# Patient Record
Sex: Female | Born: 1990 | Race: Asian | Hispanic: No | Marital: Married | Smoking: Never smoker
Health system: Southern US, Community
[De-identification: ages and names within clinical notes are randomized; demographics above are authoritative.]

## PROBLEM LIST (undated history)

## (undated) DIAGNOSIS — Z789 Other specified health status: Secondary | ICD-10-CM

## (undated) HISTORY — PX: NO PAST SURGERIES: SHX2092

---

## 2013-02-01 LAB — OB RESULTS CONSOLE RUBELLA ANTIBODY, IGM: Rubella: IMMUNE

## 2013-02-01 LAB — OB RESULTS CONSOLE ANTIBODY SCREEN: Antibody Screen: NEGATIVE

## 2013-02-01 LAB — OB RESULTS CONSOLE HEPATITIS B SURFACE ANTIGEN: Hepatitis B Surface Ag: NEGATIVE

## 2013-02-01 LAB — OB RESULTS CONSOLE GC/CHLAMYDIA
Chlamydia: POSITIVE
Gonorrhea: NEGATIVE

## 2013-02-01 LAB — OB RESULTS CONSOLE HIV ANTIBODY (ROUTINE TESTING): HIV: NONREACTIVE

## 2013-02-01 LAB — OB RESULTS CONSOLE RPR: RPR: NONREACTIVE

## 2013-02-01 LAB — OB RESULTS CONSOLE ABO/RH: "RH Type ": POSITIVE

## 2013-02-03 LAB — OB RESULTS CONSOLE GBS: STREP GROUP B AG: POSITIVE

## 2013-02-22 NOTE — L&D Delivery Note (Signed)
Called to bedside when patient noticed increased pressure and involuntary pushing. Patient was complete and pushed for 80 minutes. Head was delivered over intact perineum and shoulders were extracted from vagina without difficulty. Cord was clamped after 3 minutes. Pitocin was started for PPH prophylaxis. Placenta was removed and appeared intact. Brisk fundal massage was applied.Perineum was examined and revealed a straight 2nd degree tear, repaired with 2 interrupted deep 3-0 vicryl sutures and closed with subcuticular stitch. Vaginal bleeding was minimal. Vagina was inspected and found to be intact with no other active bleeding. Needle and sponge count was correct. Patient was cleaned and prepped for transfer to mother baby.  Delivery Note At 2:19 PM a viable female was delivered via Vaginal, Spontaneous Delivery (Presentation: Left Occiput Anterior).  APGAR: 7, 9; weight .   Placenta status: Intact, Spontaneous.  Cord: 3 vessels with the following complications: None.  Cord pH: pending  Anesthesia: Epidural  Episiotomy: None Lacerations: 2nd degree Suture Repair: 3.0 vicryl Est. Blood Loss (mL): 300  Mom to postpartum.  Baby to Nursery.  Holly Elliott, Holly Elliott A 09/06/2013, 3:02 PM

## 2013-03-01 ENCOUNTER — Other Ambulatory Visit (HOSPITAL_COMMUNITY): Payer: Self-pay | Admitting: Nurse Practitioner

## 2013-03-01 DIAGNOSIS — Z3689 Encounter for other specified antenatal screening: Secondary | ICD-10-CM

## 2013-03-29 ENCOUNTER — Ambulatory Visit (HOSPITAL_COMMUNITY): Admission: RE | Admit: 2013-03-29 | Payer: Medicaid Other | Source: Ambulatory Visit

## 2013-04-13 ENCOUNTER — Other Ambulatory Visit (HOSPITAL_COMMUNITY): Payer: Self-pay | Admitting: Nurse Practitioner

## 2013-04-13 DIAGNOSIS — Z3689 Encounter for other specified antenatal screening: Secondary | ICD-10-CM

## 2013-04-20 ENCOUNTER — Ambulatory Visit (HOSPITAL_COMMUNITY): Payer: Medicaid Other | Attending: Nurse Practitioner

## 2013-04-30 ENCOUNTER — Ambulatory Visit (HOSPITAL_COMMUNITY)
Admission: RE | Admit: 2013-04-30 | Discharge: 2013-04-30 | Disposition: A | Discharge status: Home / Self Care | Payer: Medicaid Other | Source: Ambulatory Visit | Attending: Nurse Practitioner | Admitting: Nurse Practitioner

## 2013-04-30 DIAGNOSIS — Z3689 Encounter for other specified antenatal screening: Secondary | ICD-10-CM | POA: Insufficient documentation

## 2013-04-30 NOTE — Progress Notes (Signed)
MFM ultrasound  23 yr old G1P0 at 1370w1d for fetal anatomic survey. Remote read.  Findings: 1. Single intrauterine pregnancy. 2. Estimated fetal weight is in the 53rd%. 3. Posterior placenta without evidence of previa. 4. Normal amniotic fluid volume. 5. Normal transabdominal cervical length. 6. The views of the profile/nasal bone, heart, and hands are limited. 7. The remainder of the limited anatomy survey is normal.  Recommendations: 1. Appropriate fetal growth. 2. Limited anatomy survey: - recommend follow up ultrasound in 3-4 weeks to complete anatomic survey  Eulis FosterKristen Mariah Gerstenberger, MD

## 2013-05-11 ENCOUNTER — Other Ambulatory Visit (HOSPITAL_COMMUNITY): Payer: Self-pay | Admitting: Nurse Practitioner

## 2013-05-11 DIAGNOSIS — IMO0002 Reserved for concepts with insufficient information to code with codable children: Secondary | ICD-10-CM

## 2013-05-11 DIAGNOSIS — Z0489 Encounter for examination and observation for other specified reasons: Secondary | ICD-10-CM

## 2013-06-01 ENCOUNTER — Ambulatory Visit (HOSPITAL_COMMUNITY)
Admission: RE | Admit: 2013-06-01 | Discharge: 2013-06-01 | Disposition: A | Discharge status: Home / Self Care | Payer: Medicaid Other | Source: Ambulatory Visit | Attending: Nurse Practitioner | Admitting: Nurse Practitioner

## 2013-06-01 DIAGNOSIS — IMO0002 Reserved for concepts with insufficient information to code with codable children: Secondary | ICD-10-CM

## 2013-06-01 DIAGNOSIS — Z3689 Encounter for other specified antenatal screening: Secondary | ICD-10-CM | POA: Insufficient documentation

## 2013-06-01 DIAGNOSIS — Z0489 Encounter for examination and observation for other specified reasons: Secondary | ICD-10-CM

## 2013-08-31 ENCOUNTER — Ambulatory Visit (INDEPENDENT_AMBULATORY_CARE_PROVIDER_SITE_OTHER): Payer: Medicaid Other | Admitting: *Deleted

## 2013-08-31 VITALS — BP 127/61 | HR 90

## 2013-08-31 DIAGNOSIS — O480 Post-term pregnancy: Secondary | ICD-10-CM

## 2013-08-31 LAB — US OB FOLLOW UP

## 2013-08-31 NOTE — Progress Notes (Signed)
NST performed today was reviewed and was found to be reactive.  AFI is 18.2 cm. Continue recommended antenatal testing and prenatal care.

## 2013-08-31 NOTE — Patient Instructions (Signed)
Return to clinic for any obstetric concerns or go to [facility] for evaluation  

## 2013-08-31 NOTE — Progress Notes (Signed)
Sx of labor reviewed. Interpreter present for entire visit.

## 2013-09-03 ENCOUNTER — Telehealth (HOSPITAL_COMMUNITY): Payer: Self-pay | Admitting: *Deleted

## 2013-09-03 NOTE — Telephone Encounter (Signed)
Preadmission screen  

## 2013-09-04 NOTE — Telephone Encounter (Signed)
Interpreter number [phone]

## 2013-09-05 ENCOUNTER — Encounter (HOSPITAL_COMMUNITY): Payer: Self-pay

## 2013-09-05 ENCOUNTER — Inpatient Hospital Stay (HOSPITAL_COMMUNITY)
Admission: RE | Admit: 2013-09-05 | Discharge: 2013-09-08 | DRG: 775 | Disposition: A | Discharge status: Home / Self Care | Payer: Medicaid Other | Source: Ambulatory Visit | Attending: Obstetrics & Gynecology | Admitting: Obstetrics & Gynecology

## 2013-09-05 DIAGNOSIS — O99892 Other specified diseases and conditions complicating childbirth: Secondary | ICD-10-CM | POA: Diagnosis present

## 2013-09-05 DIAGNOSIS — O9902 Anemia complicating childbirth: Secondary | ICD-10-CM | POA: Diagnosis present

## 2013-09-05 DIAGNOSIS — Z3A41 41 weeks gestation of pregnancy: Secondary | ICD-10-CM

## 2013-09-05 DIAGNOSIS — D649 Anemia, unspecified: Secondary | ICD-10-CM | POA: Diagnosis present

## 2013-09-05 DIAGNOSIS — Z22330 Carrier of Group B streptococcus: Secondary | ICD-10-CM

## 2013-09-05 DIAGNOSIS — O480 Post-term pregnancy: Secondary | ICD-10-CM | POA: Diagnosis present

## 2013-09-05 DIAGNOSIS — O9989 Other specified diseases and conditions complicating pregnancy, childbirth and the puerperium: Secondary | ICD-10-CM

## 2013-09-05 HISTORY — DX: Other specified health status: Z78.9

## 2013-09-05 LAB — CBC
HCT: 30.3 % — ABNORMAL LOW (ref 36.0–46.0)
HEMOGLOBIN: 10 g/dL — AB (ref 12.0–15.0)
MCH: 27.9 pg (ref 26.0–34.0)
MCHC: 33 g/dL (ref 30.0–36.0)
MCV: 84.4 fL (ref 78.0–100.0)
Platelets: 251 10*3/uL (ref 150–400)
RBC: 3.59 MIL/uL — AB (ref 3.87–5.11)
RDW: 12.5 % (ref 11.5–15.5)
WBC: 7.9 10*3/uL (ref 4.0–10.5)

## 2013-09-05 LAB — RAPID HIV SCREEN (WH-MAU): SUDS RAPID HIV SCREEN: NONREACTIVE

## 2013-09-05 MED ORDER — ACETAMINOPHEN 325 MG PO TABS: 650.0000 mg | ORAL_TABLET | ORAL | Status: DC | PRN

## 2013-09-05 MED ORDER — LIDOCAINE HCL (PF) 1 % IJ SOLN
30.0000 mL | INTRAMUSCULAR | Status: AC | PRN
  Administered 2013-09-06: 30 mL via SUBCUTANEOUS
  Filled 2013-09-05: qty 30

## 2013-09-05 MED ORDER — MISOPROSTOL 25 MCG QUARTER TABLET
50.0000 ug | ORAL_TABLET | Freq: Once | ORAL | Status: AC
  Administered 2013-09-05: 50 ug via ORAL
  Filled 2013-09-05: qty 1

## 2013-09-05 MED ORDER — CITRIC ACID-SODIUM CITRATE 334-500 MG/5ML PO SOLN: 30.0000 mL | ORAL | Status: DC | PRN

## 2013-09-05 MED ORDER — LACTATED RINGERS IV SOLN
INTRAVENOUS | Status: DC
  Administered 2013-09-06: 11:00:00 via INTRAVENOUS

## 2013-09-05 MED ORDER — ONDANSETRON HCL 4 MG/2ML IJ SOLN: 4.0000 mg | Freq: Four times a day (QID) | INTRAMUSCULAR | Status: DC | PRN

## 2013-09-05 MED ORDER — OXYTOCIN 40 UNITS IN LACTATED RINGERS INFUSION - SIMPLE MED
62.5000 mL/h | INTRAVENOUS | Status: DC
  Administered 2013-09-06: 62.5 mL/h via INTRAVENOUS
  Filled 2013-09-05: qty 1000

## 2013-09-05 MED ORDER — OXYCODONE-ACETAMINOPHEN 5-325 MG PO TABS: 1.0000 | ORAL_TABLET | ORAL | Status: DC | PRN

## 2013-09-05 MED ORDER — IBUPROFEN 600 MG PO TABS
600.0000 mg | ORAL_TABLET | Freq: Four times a day (QID) | ORAL | Status: DC | PRN
  Administered 2013-09-06: 600 mg via ORAL
  Filled 2013-09-05: qty 1

## 2013-09-05 MED ORDER — PENICILLIN G POTASSIUM 5000000 UNITS IJ SOLR
5.0000 10*6.[IU] | Freq: Once | INTRAVENOUS | Status: AC
  Administered 2013-09-06: 5 10*6.[IU] via INTRAVENOUS
  Filled 2013-09-05: qty 5

## 2013-09-05 MED ORDER — DEXTROSE 5 % IV SOLN
2.5000 10*6.[IU] | INTRAVENOUS | Status: DC
  Administered 2013-09-06 (×2): 2.5 10*6.[IU] via INTRAVENOUS
  Filled 2013-09-05 (×7): qty 2.5

## 2013-09-05 MED ORDER — TERBUTALINE SULFATE 1 MG/ML IJ SOLN: 0.2500 mg | Freq: Once | INTRAMUSCULAR | Status: AC | PRN

## 2013-09-05 MED ORDER — ZOLPIDEM TARTRATE 5 MG PO TABS: 5.0000 mg | ORAL_TABLET | Freq: Every evening | ORAL | Status: DC | PRN

## 2013-09-05 MED ORDER — FENTANYL CITRATE 0.05 MG/ML IJ SOLN
100.0000 ug | INTRAMUSCULAR | Status: DC | PRN
  Administered 2013-09-06 (×4): 100 ug via INTRAVENOUS
  Filled 2013-09-05 (×4): qty 2

## 2013-09-05 MED ORDER — LACTATED RINGERS IV SOLN: 500.0000 mL | INTRAVENOUS | Status: DC | PRN

## 2013-09-05 MED ORDER — OXYTOCIN BOLUS FROM INFUSION: 500.0000 mL | INTRAVENOUS | Status: DC

## 2013-09-05 NOTE — H&P (Signed)
Holly Elliott is a 23 y.o. female G1P0 with IUP at 319w3d presenting for IOL 2/2 postdates. Pt states she has been having none contractions, associated with none vaginal bleeding.  Membranes are intact, with active fetal movement.   PNCare at [facility] since 14 wks  Prenatal History/Complications: Pt. Is 23 y/o G1 with no significant PMHx and this pregnancy complicated by early chlamydia infection treated and partner treated. She is also GBS positive. 27W ultrasound shows normal fetal growth, with weight at 45%. She denies LOF, VB, Contractions up to this point. She has +FM. She is here for induction 2/2 postdates. She denies any PMHx. She has no other complaints.    Past Medical History: Past Medical History  Diagnosis Date  . Medical history non-contributory     Past Surgical History: Past Surgical History  Procedure Laterality Date  . No past surgeries      Obstetrical History: OB History   Grav Para Term Preterm Abortions TAB SAB Ect Mult Living   1               Gynecological History: OB History   Grav Para Term Preterm Abortions TAB SAB Ect Mult Living   1               Social History: History   Social History  . Marital Status: Married    Spouse Name: N/A    Number of Children: N/A  . Years of Education: N/A   Social History Main Topics  . Smoking status: None  . Smokeless tobacco: None  . Alcohol Use: No  . Drug Use: No  . Sexual Activity: None   Other Topics Concern  . None   Social History Narrative  . None    Family History: No family history on file.  Allergies: No Known Allergies  Prescriptions prior to admission  Medication Sig Dispense Refill  . Prenatal Vit-Fe Fumarate-FA (PRENATAL MULTIVITAMIN) TABS tablet Take 1 tablet by mouth daily at 12 noon.         Review of Systems   Constitutional: Per HPI above.   Blood pressure 124/69, pulse 85, temperature 98.4 F (36.9 C), temperature source Oral, resp. rate 18, height 5' (1.524 m), weight 68.04  kg (150 lb), last menstrual period 11/19/2012. General appearance: alert, cooperative and no distress Lungs: clear to auscultation bilaterally Heart: regular rate and rhythm Abdomen: soft, non-tender; bowel sounds normal Pelvic: 1.5cm, 40%, -3 Extremities: Homans sign is negative, no sign of DVT Grossly neurologically intact.  Presentation: cephalic Fetal monitoringBaseline: 135 bpm, Variability: Good {> 6 bpm) and Accelerations: Reactive Uterine activityNone Dilation: 1 Effacement (%): Thick Station: -3 Exam by:: LCarpenter,Rn   Prenatal labs: ABO, Rh: O/Positive/-- (12/11 0000) Antibody: Negative (12/11 0000) Rubella:   Immune RPR: Nonreactive (12/11 0000)  HBsAg: Negative (12/11 0000)  HIV: Non-reactive (12/11 0000)  GBS: Positive (12/13 0000)  1 hr Glucola - Unknown Genetic screening - Deferred Anatomy US - WNL   Prenatal Transfer Tool  Maternal Diabetes: Unknown. Pt. denies Genetic Screening: Declined Maternal Ultrasounds/Referrals: Normal Fetal Ultrasounds or other Referrals:  None Maternal Substance Abuse:  No Significant Maternal Medications:  None Significant Maternal Lab Results: Lab values include: Group B Strep positive, Other:  Varicella Non-Immune     Results for orders placed during the hospital encounter of 09/05/13 (from the past 24 hour(s))  CBC   Collection Time    09/05/13  8:00 PM      Result Value Ref Range   WBC 7.9  4.0 - 10.5 K/uL   RBC 3.59 (*) 3.87 - 5.11 MIL/uL   Hemoglobin 10.0 (*) 12.0 - 15.0 g/dL   HCT 16.1 (*) 09.6 - 04.5 %   MCV 84.4  78.0 - 100.0 fL   MCH 27.9  26.0 - 34.0 pg   MCHC 33.0  30.0 - 36.0 g/dL   RDW 40.9  81.1 - 91.4 %   Platelets 251  150 - 400 K/uL    Assessment: Holly Elliott is a 23 y.o. G1P0 at [redacted]w[redacted]d by L=14W U/S  here for IOL 2/2 postdates.  #Labor: Induction by Cervical ripening with cytotec, progression to foley bulb and pitocin with expectation of NSVD.  #Pain: Fentanyl #FWB: Cat I - 45% growth at 27w U/S.  Unknown Glucola, POCT glucose pending #ID:  GBS pos - PCN with active labor #MOF: Breast / Bottle #MOC: Nexplenon #Circ:  N/A  Melancon, Caleb G 09/05/2013, 11:36 PM  I have seen and examined this patient and I agree with the above. Cam Hai CNM 8:59 AM 09/06/2013

## 2013-09-05 NOTE — Progress Notes (Signed)
Dr. Reola CalkinsBeck called for orders for 1930 induction.

## 2013-09-06 ENCOUNTER — Encounter (HOSPITAL_COMMUNITY): Payer: Self-pay

## 2013-09-06 ENCOUNTER — Encounter (HOSPITAL_COMMUNITY): Payer: Medicaid Other | Admitting: Anesthesiology

## 2013-09-06 ENCOUNTER — Inpatient Hospital Stay (HOSPITAL_COMMUNITY): Payer: Medicaid Other | Admitting: Anesthesiology

## 2013-09-06 DIAGNOSIS — D649 Anemia, unspecified: Secondary | ICD-10-CM

## 2013-09-06 DIAGNOSIS — O480 Post-term pregnancy: Secondary | ICD-10-CM

## 2013-09-06 DIAGNOSIS — O99892 Other specified diseases and conditions complicating childbirth: Secondary | ICD-10-CM

## 2013-09-06 DIAGNOSIS — O9989 Other specified diseases and conditions complicating pregnancy, childbirth and the puerperium: Secondary | ICD-10-CM

## 2013-09-06 LAB — HEPATITIS B SURFACE ANTIGEN: Hepatitis B Surface Ag: NEGATIVE

## 2013-09-06 LAB — RPR

## 2013-09-06 LAB — TYPE AND SCREEN
ABO/RH(D): O POS
Antibody Screen: NEGATIVE

## 2013-09-06 LAB — VARICELLA ZOSTER ANTIBODY, IGG

## 2013-09-06 LAB — ABO/RH: ABO/RH(D): O POS

## 2013-09-06 LAB — RUBELLA SCREEN: Rubella: 4.02 Index — ABNORMAL HIGH (ref <0.90)

## 2013-09-06 MED ORDER — PRENATAL MULTIVITAMIN CH
1.0000 | ORAL_TABLET | Freq: Every day | ORAL | Status: DC
  Administered 2013-09-07: 1 via ORAL
  Filled 2013-09-06: qty 1

## 2013-09-06 MED ORDER — SIMETHICONE 80 MG PO CHEW: 80.0000 mg | CHEWABLE_TABLET | ORAL | Status: DC | PRN

## 2013-09-06 MED ORDER — IBUPROFEN 600 MG PO TABS
600.0000 mg | ORAL_TABLET | Freq: Four times a day (QID) | ORAL | Status: DC
  Administered 2013-09-07 – 2013-09-08 (×6): 600 mg via ORAL
  Filled 2013-09-06 (×6): qty 1

## 2013-09-06 MED ORDER — MISOPROSTOL 25 MCG QUARTER TABLET
25.0000 ug | ORAL_TABLET | ORAL | Status: DC
  Filled 2013-09-06 (×6): qty 1

## 2013-09-06 MED ORDER — PHENYLEPHRINE 40 MCG/ML (10ML) SYRINGE FOR IV PUSH (FOR BLOOD PRESSURE SUPPORT)
80.0000 ug | PREFILLED_SYRINGE | INTRAVENOUS | Status: DC | PRN
  Filled 2013-09-06: qty 2

## 2013-09-06 MED ORDER — FENTANYL 2.5 MCG/ML BUPIVACAINE 1/10 % EPIDURAL INFUSION (WH - ANES)
INTRAMUSCULAR | Status: AC
  Filled 2013-09-06: qty 125

## 2013-09-06 MED ORDER — FENTANYL 2.5 MCG/ML BUPIVACAINE 1/10 % EPIDURAL INFUSION (WH - ANES)
INTRAMUSCULAR | Status: DC | PRN
  Administered 2013-09-06: 12 mL/h via EPIDURAL

## 2013-09-06 MED ORDER — LACTATED RINGERS IV SOLN
500.0000 mL | Freq: Once | INTRAVENOUS | Status: AC
  Administered 2013-09-06: 500 mL via INTRAVENOUS

## 2013-09-06 MED ORDER — FENTANYL 2.5 MCG/ML BUPIVACAINE 1/10 % EPIDURAL INFUSION (WH - ANES): 14.0000 mL/h | INTRAMUSCULAR | Status: DC | PRN

## 2013-09-06 MED ORDER — DIPHENHYDRAMINE HCL 25 MG PO CAPS
25.0000 mg | ORAL_CAPSULE | Freq: Four times a day (QID) | ORAL | Status: DC | PRN
Start: 2013-09-06 — End: 2013-09-08

## 2013-09-06 MED ORDER — PHENYLEPHRINE 40 MCG/ML (10ML) SYRINGE FOR IV PUSH (FOR BLOOD PRESSURE SUPPORT)
PREFILLED_SYRINGE | INTRAVENOUS | Status: AC
  Filled 2013-09-06: qty 10

## 2013-09-06 MED ORDER — EPHEDRINE 5 MG/ML INJ
10.0000 mg | INTRAVENOUS | Status: DC | PRN
  Filled 2013-09-06: qty 2

## 2013-09-06 MED ORDER — ONDANSETRON HCL 4 MG PO TABS: 4.0000 mg | ORAL_TABLET | ORAL | Status: DC | PRN

## 2013-09-06 MED ORDER — ZOLPIDEM TARTRATE 5 MG PO TABS: 5.0000 mg | ORAL_TABLET | Freq: Every evening | ORAL | Status: DC | PRN

## 2013-09-06 MED ORDER — DIBUCAINE 1 % RE OINT: 1.0000 "application " | TOPICAL_OINTMENT | RECTAL | Status: DC | PRN

## 2013-09-06 MED ORDER — OXYCODONE-ACETAMINOPHEN 5-325 MG PO TABS: 1.0000 | ORAL_TABLET | ORAL | Status: DC | PRN

## 2013-09-06 MED ORDER — TETANUS-DIPHTH-ACELL PERTUSSIS 5-2.5-18.5 LF-MCG/0.5 IM SUSP: 0.5000 mL | Freq: Once | INTRAMUSCULAR | Status: DC

## 2013-09-06 MED ORDER — ONDANSETRON HCL 4 MG/2ML IJ SOLN: 4.0000 mg | INTRAMUSCULAR | Status: DC | PRN

## 2013-09-06 MED ORDER — DIPHENHYDRAMINE HCL 50 MG/ML IJ SOLN: 12.5000 mg | INTRAMUSCULAR | Status: DC | PRN

## 2013-09-06 MED ORDER — BENZOCAINE-MENTHOL 20-0.5 % EX AERO
1.0000 "application " | INHALATION_SPRAY | CUTANEOUS | Status: DC | PRN
  Administered 2013-09-06: 1 via TOPICAL
  Filled 2013-09-06: qty 56

## 2013-09-06 MED ORDER — LANOLIN HYDROUS EX OINT: TOPICAL_OINTMENT | CUTANEOUS | Status: DC | PRN

## 2013-09-06 MED ORDER — WITCH HAZEL-GLYCERIN EX PADS: 1.0000 "application " | MEDICATED_PAD | CUTANEOUS | Status: DC | PRN

## 2013-09-06 MED ORDER — SENNOSIDES-DOCUSATE SODIUM 8.6-50 MG PO TABS
2.0000 | ORAL_TABLET | ORAL | Status: DC
  Administered 2013-09-07 – 2013-09-08 (×2): 2 via ORAL
  Filled 2013-09-06 (×2): qty 2

## 2013-09-06 MED ORDER — TERBUTALINE SULFATE 1 MG/ML IJ SOLN
INTRAMUSCULAR | Status: AC
  Administered 2013-09-06: 0.25 mg
  Filled 2013-09-06: qty 1

## 2013-09-06 MED ORDER — LIDOCAINE HCL (PF) 1 % IJ SOLN
INTRAMUSCULAR | Status: DC | PRN
  Administered 2013-09-06 (×2): 4 mL

## 2013-09-06 NOTE — Anesthesia Preprocedure Evaluation (Addendum)
Anesthesia Evaluation  Patient identified by MRN, date of birth, ID band Patient awake    Reviewed: Allergy & Precautions, H&P , Patient's Chart, lab work & pertinent test results  Airway Mallampati: III TM Distance: >3 FB Neck ROM: Full    Dental no notable dental hx. (+) Teeth Intact   Pulmonary neg pulmonary ROS,  breath sounds clear to auscultation  Pulmonary exam normal       Cardiovascular negative cardio ROS  Rhythm:Regular Rate:Normal     Neuro/Psych negative neurological ROS  negative psych ROS   GI/Hepatic negative GI ROS, Neg liver ROS,   Endo/Other  negative endocrine ROS  Renal/GU negative Renal ROS  negative genitourinary   Musculoskeletal negative musculoskeletal ROS (+)   Abdominal   Peds  Hematology  (+) anemia ,   Anesthesia Other Findings   Reproductive/Obstetrics (+) Pregnancy                          Anesthesia Physical Anesthesia Plan  ASA: II  Anesthesia Plan: Epidural   Post-op Pain Management:    Induction:   Airway Management Planned: Natural Airway  Additional Equipment:   Intra-op Plan:   Post-operative Plan:   Informed Consent: I have reviewed the patients History and Physical, chart, labs and discussed the procedure including the risks, benefits and alternatives for the proposed anesthesia with the patient or authorized representative who has indicated his/her understanding and acceptance.     Plan Discussed with: Anesthesiologist  Anesthesia Plan Comments:         Anesthesia Quick Evaluation

## 2013-09-06 NOTE — Progress Notes (Signed)
[employer] [phone]   ) used to translate plan of care, options for pain relief, and epidural procedure.

## 2013-09-06 NOTE — Lactation Note (Signed)
This note was copied from the chart of Holly Elliott. Lactation Consultation Note  Patient Name: Holly Malena CatholicHMan Turbin WUJWJ'XToday's Date: 09/06/2013 Reason for consult: Initial assessment;Other (Comment) (charting for exclusion) Mom's female friend is at bedside, as well as FOB and her female friend is able to translate basic breastfeeding information to mom.  LC reviewed LEAD cautions regarding formula supplement and recommendations of waiting at least 2 weeks before supplementing if possible.  Plan discussed with RN, Marcelino DusterMichelle who also cautioned mom that supplementation could interfere with optimum milk supply.  LC encouraged STS and cue feedings.  Mom encouraged to feed baby 8-12 times/24 hours and with feeding cues. LC encouraged review of Baby and Me pp 9, 14 and 20-25 for STS and BF information. LC provided [employer] Resource brochure and reviewed [facility] services and list of community and web site resources.   Maternal Data Formula Feeding for Exclusion: Yes Reason for exclusion: Mother's choice to formula and breast feed on admission (LEAD cautions reviewed) Infant to breast within first hour of birth: Yes Has patient been taught Hand Expression?: Yes (mom informs through interpreter that she was shown) Does the patient have breastfeeding experience prior to this delivery?: No  Feeding Feeding Type: Breast Fed  LATCH Score/Interventions           attempts and STS thus far; baby sleepy           Lactation Tools Discussed/Used   STS, cue feedings, hand expression, LEAD cautions regarding breast and formula feeding  Consult Status Consult Status: Follow-up Date: 09/07/13 Follow-up type: In-patient    Warrick ParisianBryant, Akshita Italiano [facility] 09/06/2013, 6:37 PM

## 2013-09-06 NOTE — Progress Notes (Signed)
Holly Elliott is a 23 y.o. G1P0 at Holly Mink4365w4d by ultrasound admitted for induction of labor due to Post dates. Due date 08/26/2013.  Subjective: Called to bedside by nursing staff after prolonged decel. Patient placed on side, FHR improved. Patient complains of pain, requesting more fentanyl. Used language line interpreter to discuss pain control. Patient interested in epidural placement  Objective: BP 125/69  Pulse 99  Temp(Src) 98 F (36.7 C) (Oral)  Resp 18  Ht 5' (1.524 m)  Wt 68.04 kg (150 lb)  BMI 29.30 kg/m2  SpO2 99%  LMP 11/19/2012      FHT:  FHR: 140 bpm, variability: moderate,  accelerations:  Present,  decelerations:  Present 5 minutes prolonged decel UC:   irregular, every 3-5 minutes SVE:   Dilation: 5 Effacement (%): 80 Station: 0;-1 Exam by:: dr. Zonia KiefStephens  Labs: Lab Results  Component Value Date   WBC 7.9 09/05/2013   HGB 10.0* 09/05/2013   HCT 30.3* 09/05/2013   MCV 84.4 09/05/2013   PLT 251 09/05/2013    Assessment / Plan: Induction for post dates, received cytotec, progressing as expected  Labor: Progressing normally Preeclampsia:  N/A Fetal Wellbeing:  Category II called for 5 minute decel, patient placed on side, has moderate variability, no other concerns Pain Control:  Fentanyl and Desires epidural I/D:  n/a Anticipated MOD:  NSVD  Markus JarvisStephens, Graycen Degan A 09/06/2013, 10:10 AM

## 2013-09-06 NOTE — Progress Notes (Signed)
I have seen this patient and agree with the above resident's note.  LEFTWICH-KIRBY, Basheer Molchan Certified Nurse-Midwife 

## 2013-09-06 NOTE — Progress Notes (Signed)
Holly Elliott is a 23 y.o. G1P0 at 6766w4d by ultrasound admitted for active labor  Subjective: Pt. Becoming more uncomfortable. Requesting pain medication. Attempted foley bulb, but exam resulted in SROM.  No complaints at this time.   Objective: BP 127/74  Pulse 77  Temp(Src) 97.7 F (36.5 C) (Oral)  Resp 18  Ht 5' (1.524 m)  Wt 68.04 kg (150 lb)  BMI 29.30 kg/m2  SpO2 99%  LMP 11/19/2012      FHT:  FHR: 135 bpm, variability: moderate,  accelerations:  Present,  decelerations:  Absent UC:   regular, every 1-2 minutes SVE:   Dilation: 3cm Effacement: 90% Station: -3  Labs: Lab Results  Component Value Date   WBC 7.9 09/05/2013   HGB 10.0* 09/05/2013   HCT 30.3* 09/05/2013   MCV 84.4 09/05/2013   PLT 251 09/05/2013    Assessment / Plan: Induction of labor due to postterm,  progressing well on pitocin  Labor: Progressing normally and s/p SROM with exam. Holding cytotec and pit for now given contraction frequency.  Preeclampsia:  no signs or symptoms of toxicity Fetal Wellbeing:  Category I Pain Control:  Fentanyl I/D:  PCN started  Anticipated MOD:  NSVD  Cris Talavera G 09/06/2013, 3:51 AM

## 2013-09-06 NOTE — Progress Notes (Signed)
I have seen and examined this patient and I agree with the above. Stanislaus Kaltenbach CNM 9:00 AM 09/06/2013    

## 2013-09-06 NOTE — Progress Notes (Signed)
I have seen this patient and agree with the above resident's note.  See delivery summary.   LEFTWICH-KIRBY, Breena Bevacqua Certified Nurse-Midwife

## 2013-09-06 NOTE — Progress Notes (Signed)
Holly Elliott is a 23 y.o. G1P0 at 5167w4d by ultrasound admitted for induction of labor due to Post dates. Due date 08/26/2013.  Subjective: Called to bedside when patient noticed increased pressure and involuntary pushing. Patient was complete and pushed for 80 minutes. Head was delivered over intact perineum and shoulders were extracted from vagina without difficulty. Cord was clamped after 3 minutes. Pitocin was started for PPH prophylaxis. Placenta was removed and appeared intact. Brisk fundal massage was applied.Perineum was examined and revealed a straight 2nd degree tear, repaired with 2 interrupted deep 3-0 vicryl sutures and closed with subcuticular stitch. Vaginal bleeding was minimal. Vagina was inspected and found to be intact with no other active bleeding. Needle and sponge count was correct. Patient was cleaned and prepped for transfer to mother baby.  Objective: BP 138/81  Pulse 106  Temp(Src) 98 F (36.7 C) (Oral)  Resp 16  Ht 5' (1.524 m)  Wt 68.04 kg (150 lb)  BMI 29.30 kg/m2  SpO2 99%  LMP 11/19/2012      FHT:  FHR: 120 bpm, variability: moderate,  accelerations:  Present,  decelerations:  Absent UC:   regular, every 2-3 minutes SVE:   Dilation: Lip/rim Effacement (%): 80 Station: 0;-1 Exam by:: dr. Zonia Kiefstephens  Labs: Lab Results  Component Value Date   WBC 7.9 09/05/2013   HGB 10.0* 09/05/2013   HCT 30.3* 09/05/2013   MCV 84.4 09/05/2013   PLT 251 09/05/2013    Assessment / Plan: Patient is 22 G1P1 at 5267w4d, admitted for induction for post dates pregnancy, now s/p vaginal delivery. Patient doing well, blood loss was acceptable, transfer to mother baby.  Labor: Delivered Preeclampsia:  N/A Fetal Wellbeing:  Category I Pain Control:  Epidural I/D:  n/a Anticipated MOD:  NSVD  Markus JarvisStephens, Stephan Nelis A 09/06/2013, 12:17 PM

## 2013-09-06 NOTE — Anesthesia Procedure Notes (Signed)
Epidural Patient location during procedure: OB Start time: 09/06/2013 10:39 AM  Preanesthetic Checklist Completed: patient identified, site marked, surgical consent, pre-op evaluation, timeout performed, IV checked, risks and benefits discussed and monitors and equipment checked  Epidural Patient position: sitting Prep: site prepped and draped and DuraPrep Patient monitoring: continuous pulse ox and blood pressure Approach: midline Location: L3-L4 Injection technique: LOR air  Needle:  Needle type: Tuohy  Needle gauge: 17 G Needle length: 9 cm and 9 Needle insertion depth: 4 cm Catheter type: closed end flexible Catheter size: 19 Gauge Catheter at skin depth: 9 cm Test dose: negative and Other  Assessment Events: blood not aspirated, injection not painful, no injection resistance, negative IV test and no paresthesia  Additional Notes Patient identified. Risks and benefits discussed including failed block, incomplete  Pain control, post dural puncture headache, nerve damage, paralysis, blood pressure Changes, nausea, vomiting, reactions to medications-both toxic and allergic and post Partum back pain. All questions were answered. Patient expressed understanding and wished to proceed. Sterile technique was used throughout procedure. Epidural site was Dressed with sterile barrier dressing. No paresthesias, signs of intravascular injection Or signs of intrathecal spread were encountered. [place] interpreter used over phone throughout procedure. Patient was more comfortable after the epidural was dosed. Please see RN's note for documentation of vital signs and FHR which are stable.

## 2013-09-07 LAB — CBC
HCT: 27.3 % — ABNORMAL LOW (ref 36.0–46.0)
Hemoglobin: 9 g/dL — ABNORMAL LOW (ref 12.0–15.0)
MCH: 27.6 pg (ref 26.0–34.0)
MCHC: 33 g/dL (ref 30.0–36.0)
MCV: 83.7 fL (ref 78.0–100.0)
PLATELETS: 194 10*3/uL (ref 150–400)
RBC: 3.26 MIL/uL — AB (ref 3.87–5.11)
RDW: 12.6 % (ref 11.5–15.5)
WBC: 12.7 10*3/uL — ABNORMAL HIGH (ref 4.0–10.5)

## 2013-09-07 NOTE — Progress Notes (Signed)
UR chart review completed.  

## 2013-09-07 NOTE — Lactation Note (Signed)
This note was copied from the chart of Holly Izabel Farabee. Lactation Consultation Note  Follow up care at 28 hours mom is alone in the room with baby's mouth touching nipple asleep.  Used [employer] Licensed conveyancerinterpreter services for [place] translation.  Encouraged and demonstrated on waking techniques.  Mom resistant on positioning assistance evident by pushing my hand away or holding it down.  Mom reports with interpreter that she is not having pain and wants to breast feed.  Baby latched with mouth wide and lips flanged, transfer of milk was not observed at this time.  Encouraged 8-12 feedings in 24 hours.  Baby is also getting bottles, encouraged mom to breast feed first.    [facility] and discussed possible cognitive delay with mom.     Patient Name: Holly Elliott ZOXWR'UToday's Date: 09/07/2013 Reason for consult: Follow-up assessment   Maternal Data Has patient been taught Hand Expression?: Yes Does the patient have breastfeeding experience prior to this delivery?: No  Feeding Feeding Type: Breast Fed Nipple Type: Slow - flow Length of feed:  (few minutes of sucking)  LATCH Score/Interventions Latch: Repeated attempts needed to sustain latch, nipple held in mouth throughout feeding, stimulation needed to elicit sucking reflex. Intervention(s): Waking techniques  Audible Swallowing: None Intervention(s): Skin to skin  Type of Nipple: Everted at rest and after stimulation  Comfort (Breast/Nipple): Soft / non-tender     Hold (Positioning): Assistance needed to correctly position infant at breast and maintain latch. Intervention(s): Position options;Support Pillows;Breastfeeding basics reviewed  LATCH Score: 6  Lactation Tools Discussed/Used     Consult Status Consult Status: Follow-up Date: 09/08/13 Follow-up type: In-patient    Shoptaw, Arvella MerlesJana Lynn 09/07/2013, 7:10 PM

## 2013-09-07 NOTE — Progress Notes (Signed)
Post Partum Day 1 Subjective: no complaints, up ad lib, voiding, tolerating PO and + flatus  Objective: Blood pressure 117/62, pulse 78, temperature 98.6 F (37 C), temperature source Oral, resp. rate 18, height 5' (1.524 m), weight 68.04 kg (150 lb), last menstrual period 11/19/2012, SpO2 99.00%, unknown if currently breastfeeding.  Physical Exam:  General: alert, cooperative and no distress Lochia: appropriate Uterine Fundus: firm Incision: healing well, some mild erythema of vaginal laceration, reduced swelling since yesterday. DVT Evaluation: No evidence of DVT seen on physical exam. Negative Homan's sign. No cords or calf tenderness.   Recent Labs  09/05/13 2000 09/07/13 0705  HGB 10.0* 9.0*  HCT 30.3* 27.3*    Assessment/Plan: Plan for discharge tomorrow  No concerning signs. Encourage ambulation. Will watch for any signs of developing infection in vaginal laceration repair.   LOS: 2 days   Teodora MediciStephens, Devin A 09/07/2013, 9:28 AM   Seen also by me WIll defer discharge until tomorrow   Aviva SignsMarie L Katerin Negrete, CNM

## 2013-09-07 NOTE — Anesthesia Postprocedure Evaluation (Signed)
Anesthesia Post Note  Patient: Holly Elliott  Procedure(s) Performed: * No procedures listed *  Anesthesia type: Epidural  Patient location: Mother/Baby  Post pain: Pain level controlled  Post assessment: Post-op Vital signs reviewed  Last Vitals:  Filed Vitals:   09/07/13 0437  BP: 117/62  Pulse: 78  Temp:   Resp: 18    Post vital signs: Reviewed  Level of consciousness:alert  Complications: No apparent anesthesia complications

## 2013-09-08 LAB — GC/CHLAMYDIA PROBE AMP
CT Probe RNA: NEGATIVE
GC PROBE AMP APTIMA: NEGATIVE

## 2013-09-08 MED ORDER — DOCUSATE SODIUM 100 MG PO CAPS: 100.0000 mg | ORAL_CAPSULE | Freq: Two times a day (BID) | ORAL | Status: AC

## 2013-09-08 MED ORDER — IBUPROFEN 600 MG PO TABS: 600.0000 mg | ORAL_TABLET | Freq: Four times a day (QID) | ORAL | Status: AC

## 2013-09-08 MED ORDER — IBUPROFEN 600 MG PO TABS: 600.0000 mg | ORAL_TABLET | Freq: Four times a day (QID) | ORAL | Status: DC

## 2013-09-08 MED ORDER — DOCUSATE SODIUM 100 MG PO CAPS: 100.0000 mg | ORAL_CAPSULE | Freq: Two times a day (BID) | ORAL | Status: DC

## 2013-09-08 NOTE — Discharge Summary (Signed)
Obstetric Discharge Summary Reason for Admission: induction of labor Prenatal Procedures: ultrasound Intrapartum Procedures: spontaneous vaginal delivery, GBS ppx Postpartum Procedures: none Complications-Operative and Postpartum: 2nd degree perineal laceration  Hospital Course: Pt. Is 23 y/o G1 with no significant PMHx and this pregnancy complicated by early chlamydia infection treated and partner treated. She is also GBS positive. 27W ultrasound shows normal fetal growth, with weight at 45%. She denies LOF, VB, Contractions up to this point. She has +FM. She presented for induction at 932w4d with cytotec and foley bulb.  Today feeling well, reporting some vaginal swelling and pain.  +passing gas, +BM, no dysuria/difficulty urinating, breast and bottle feeding, moderate lochia, desires nexplanon for contraception  PE BP 120/67   Pulse 76   Temp(Src) 98.1 F (36.7 C) (Oral)   Resp 18   Ht 5' (1.524 m)   Wt 68.04 kg (150 lb)   BMI 29.30 kg/m2   SpO2 99%   LMP 11/19/2012   Breastfeeding? Unknown Gen: comfortable, calm, NAD Resp: LCTAB, no resp distress Cardio: RRR. No m/r/c GU: labial swelling  Delivery Note At 2:19 PM a viable female was delivered via Vaginal, Spontaneous Delivery (Presentation: Left Occiput Anterior).  APGAR: 7, 9; weight 8 lb 2.9 oz (3710 g).   Placenta status: Intact, Spontaneous.  Cord: 3 vessels with the following complications: None.  Anesthesia: Epidural  Episiotomy: None Lacerations: 2nd degree Suture Repair: 3.0 vicryl Est. Blood Loss (mL): 300  Fredirick LatheKristy Acosta 09/08/2013, 12:34 PM     H/H: Lab Results  Component Value Date/Time   HGB 9.0* 09/07/2013  7:05 AM   HCT 27.3* 09/07/2013  7:05 AM      Discharge Diagnoses: group b strep positive, normal vaginal delivery, second degree laceration, hx of chlamydia in pregnancy  Discharge Information: Date: 09/03/2010 Activity: pelvic rest Diet: routine  Medications: Ibuprofen and Colace Breast feeding:   Yes and bottle feeding Condition: stable Instructions: refer to handout Discharge to: home      Medication List    ASK your doctor about these medications       prenatal multivitamin Tabs tablet  Take 1 tablet by mouth daily at 12 noon.      ferrous sulfate, colace   Fredirick LatheKristy Acosta ,MD OB Fellow 09/08/2013,12:34 PM

## 2013-09-08 NOTE — Discharge Summary (Signed)
Attestation of Attending Supervision of Advanced Practitioner (CNM/NP): Evaluation and management procedures were performed by the Advanced Practitioner under my supervision and collaboration.  I have reviewed the Advanced Practitioner's note and chart, and I agree with the management and plan.  HARRAWAY-SMITH, Shequilla Goodgame 1:00 PM

## 2013-09-08 NOTE — Discharge Instructions (Signed)
Sinh N? qua Âm ??o, Ch?m Sóc Sau khí Sinh °(Vaginal Delivery, Care After) °Tham kh?o t? thông tin này trong vài tu?n t?i. Nh?ng h??ng d?n xu?t vi?n này cung c?p cho b?n thông tin v? ch?m sóc b?n thân sau khi sinh. Chuyên gia ch?m sóc s?c kh?e c?ng có th? cung c?p cho b?n các h??ng d?n c? th?. ?i?u tr? c?a b?n ?ã ???c lên k? ho?ch theo th?c hành y khoa m?i nh?t s?n có, nh?ng v?n ?? ?ôi khi v?n x?y ra. Hãy g?i cho chuyên gia ch?m sóc s?c kh?e c?a mình n?u b?n có b?t k? v?n ?? ho?c câu h?i nào sau khi v? nhà. °H??NG D?N CH?M SÓC T?I NHÀ °· Ch? s? d?ng thu?c không c?n kê toa ho?c thu?c c?n kê toa theo ch? d?n c?a chuyên gia ch?m sóc s?c kh?e ho?c d??c s?. °· Không u?ng r??u ??c bi?t khi b?n ?ang nuôi con bú ho?c u?ng thu?c ?? gi?m ?au. °· Không nhai thu?c lá ho?c hút thu?c. °· Không s? d?ng ma túy b?t h?p pháp. °· Ti?p t?c ch?m sóc t?t vùng ?áy ch?u. Ch?m sóc t?t vùng ?áy ch?u bao g?m: °¨ Lau ?áy ch?u c?a b?n t? tr??c ra sau. °¨ Gi? ?áy ch?u c?a b?n s?ch s?. °· Không s? d?ng b?ng v? sinh ho?c th?t r?a âm ??o cho ??n khi chuyên gia ch?m sóc s?c kh?e cho phép. °· T?m, g?i ??u và t?m b?n theo ch? d?n c?a chuyên gia ch?m sóc s?c kh?e. °· M?c áo ng?c v?a v?n h? tr? ng?c. °· ?n th?c ph?m có l?i cho s?c kh?e. °· U?ng ?? n??c ?? gi? cho n??c ti?u trong ho?c vàng nh?t. °· ?n các lo?i th?c ph?m nhi?u ch?t x? nh? ng? c?c nguyên h?t, bánh mì, g?o l?c, các lo?i ??u, trái cây t??i và rau qu? m?i ngày. Nh?ng th?c ph?m này có th? giúp ng?n ng?a ho?c làm gi?m táo bón. °· Làm hi?n theo khuy?n ngh? c?a chuyên gia ch?m sóc s?c kh?e v? vi?c b?t ??u l?i các ho?t ??ng nh? leo c?u thang, lái xe, nâng, t?p th? d?c ho?c ?i l?i. °· Nói chuy?n v?i chuyên gia ch?m sóc s?c kh?e v? vi?c b?t ??u l?i ho?t ??ng tình d?c. Vi?c b?t ??u l?i ho?t ??ng tình d?c tùy thu?c vào nguy c? nhi?m trùng, m?c ?? lành b?nh, c?m giác tho?i mái và mong mu?n ti?p t?c ho?t ??ng tình d?c c?a b?n. °· C? g?ng ?? có ai ?ó giúp b?n các công vi?c n?i tr? và tr? m?i sinh cho ít  nh?t m?t vài ngày sau khi xu?t vi?n. °· Ngh? ng?i càng nhi?u càng t?t. C? g?ng ngh? ng?i ho?c có gi?c ng? ng?n khi tr? m?i sinh ?ang ng?. °· T?ng ho?t ??ng d?n d?n. °· Tuân th? m?i cu?c h?n liên quan ngay sau khi sinh ?ã ???c s?p x?p l?ch. ?i?u r?t quan tr?ng là ph?i gi? m?i cu?c h?n khám l?i ?ã ???c s?p x?p l?ch. T?i các cu?c h?n này, chuyên gia ch?m sóc s?c kh?e s? ki?m tra ?? ??m b?o r?ng b?n ?ang h?i ph?c v? m?t th? ch?t và tình c?m. °HÃY ?I KHÁM N?U: °· Âm ??o cho ra các c?c máu ?ông l?n. Gi? l?i m?i c?c máu ?ông ?? chuyên gia ch?m sóc s?c kh?e xem. °· D?ch âm ??o có mùi hôi. °· B?n g?p v?n ?? khi ti?u ti?n. °· B?n ?i ti?u th??ng xuyên. °· B?n b? ?au khi ?i ti?u. °· Có s? thay ??i trong ??  i ti?n. °· B? t?y ??, ?au ho?c s?ng g?n v?t r?ch âm ??o (c?t t?ng sinh môn) hay v?t rách âm ??o. °· Có m? ch?y ra t? v?t c?t t?ng sinh môn ho?c v?t rách âm ??o. °· V?t c?t t?ng sinh môn ho?c v?t rách âm ??o há mi?ng. °· Ng?c b? ?au, c?ng ho?c ??. °· B?n b? ?au ??u r?t nhi?u. °· M?t b?n b? m? ho?c nhìn th?y các v?t l?m ??m. °· B?n c?m th?y bu?n ho?c chán n?n. °· B?n có suy ngh? v? vi?c t? làm t?n th??ng mình ho?c tr? m?i sinh. °· B?n có câu h?i v? vi?c ch?m sóc b?n thân, ch?m sóc tr? m?i sinh ho?c v? thu?c. °· B?n b? chóng m?t ho?c ?au ??u. °· B?n b? phát ban. °· B?n b? bu?n nôn ho?c nôn m?a. °· B?n ?ã cho con bú và ch?a có kinh nguy?t trong vòng 12 tu?n sau khi ng?ng cho con bú. °· B?n không cho con bú và ch?a có kinh nguy?t trong kho?ng 12 tu?n sau khi sinh. °· B?n b? s?t. °HÃY NGAY L?P T?C ?I KHÁM N?U: °· B?n b? ?au dai d?ng. °· B?n b? ?au ng?c. °· B?n b? khó th?. °· B?n b? ng?t. °· B?n b? ?au chân. °· B?n b? ?au d? dày. °· Ch?y máu âm ??o th?m ??m hai mi?ng dán v? sinh tr? lên trong 1 ti?ng. °??M B?O B?N: °· Hi?u các h??ng d?n này. °· S? theo dõi tình tr?ng c?a mình. °· S? yêu c?u tr? giúp ngay l?p t?c n?u b?n c?m th?y không ?? ho?c tình tr?ng tr?m tr?ng h?n. °Document Released: 02/08/2005 Document Revised:  10/11/2012 °[employer]® Patient Information ©2015 [employer]. This information is not intended to replace advice given to you by your health care provider. Make sure you discuss any questions you have with your health care provider. ° °

## 2013-09-08 NOTE — Lactation Note (Signed)
This note was copied from the chart of Girl Reannah Kakar. Lactation Consultation Note  Patient Name: Girl Malena CatholicHMan Dygert NFAOZ'HToday's Date: 09/08/2013 Reason for consult: Follow-up assessment Baby 46 hours of life. Using [employer] [place], Interpreter [place], mom reports that while she has breastfed in hospital, she plans to exclusively offer formula once she leaves hospital. Discussed engorgement prevention/treatment. Referred parents to Baby and Me booklet for number of diapers to expect and LC number to call for assist with breast engorgement as needed.   Maternal Data    Feeding    LATCH Score/Interventions                      Lactation Tools Discussed/Used     Consult Status Consult Status: Complete    Geralynn OchsWILLIARD, Korryn Pancoast 09/08/2013, 12:44 PM

## 2013-12-24 ENCOUNTER — Encounter (HOSPITAL_COMMUNITY): Payer: Self-pay

## 2014-01-25 ENCOUNTER — Encounter: Payer: Self-pay | Admitting: Obstetrics & Gynecology

## 2015-07-20 IMAGING — US US OB COMP +14 WK
2 series · 12 of 28 positions shown · non-contrast
Comparison: none

OBSTETRICS REPORT
                      (Signed Final 04/30/2013 [DATE])

Service(s) Provided
 US OB COMP + 14 WK                                    76805.1
Indications
 Basic anatomic survey
Fetal Evaluation
 Num Of Fetuses:    1
 Fetal Heart Rate:  155                          bpm
 Cardiac Activity:  Observed
 Presentation:      Cephalic
 Placenta:          Posterior, above cervical
                    os
 P. Cord            Visualized, central
 Insertion:
 Amniotic Fluid
 AFI FV:      Subjectively within normal limits
                                             Larg Pckt:     4.3  cm
Biometry
 BPD:     57.3  mm     G. Age:  23w 4d                CI:        75.01   70 - 86
                                                      FL/HC:      19.3   19.2 -
 HC:     209.9  mm     G. Age:  23w 1d       33  %    HC/AC:      1.15   1.05 -
 AC:     183.1  mm     G. Age:  23w 1d       42  %    FL/BPD:     70.7   71 - 87
 FL:      40.5  mm     G. Age:  23w 1d       38  %    FL/AC:      22.1   20 - 24
 HUM:     37.1  mm     G. Age:  23w 0d       39  %
 CER:     24.3  mm     G. Age:  22w 2d       37  %
 Est. FW:     566  gm      1 lb 4 oz     53  %
Gestational Age
 Clinical EDD:  23w 1d                                        EDD:   08/26/13
 U/S Today:     23w 2d                                        EDD:   08/25/13
 Best:          23w 1d     Det. By:  Clinical EDD             EDD:   08/26/13
Anatomy
 Cranium:          Appears normal         Aortic Arch:      Not well visualized
 Fetal Cavum:      Appears normal         Ductal Arch:      Not well visualized
 Ventricles:       Appears normal         Diaphragm:        Appears normal
 Choroid Plexus:   Appears normal         Stomach:          Appears normal, left
                                                            sided
 Cerebellum:       Appears normal         Abdomen:          Appears normal
 Posterior Fossa:  Appears normal         Abdominal Wall:   Appears nml (cord
                                                            insert, abd wall)
 Nuchal Fold:      Not applicable (>20    Cord Vessels:     Appears normal (3
                   wks GA)                                  vessel cord)
 Face:             Orbits appear          Kidneys:          Appear normal
                   normal; profile not
                   seen
 Lips:             Appears normal         Bladder:          Appears normal
 Heart:            Appears normal         Spine:            Appears normal
                   (4CH, axis, and
                   situs)
 RVOT:             Not well visualized    Lower             Appears normal
                                          Extremities:
 LVOT:             Appears normal         Upper             Appears normal
 Other:  Fetus appears to be a female. Heels visualized. Technically difficult
         due to fetal position.
Targeted Anatomy
 Fetal Central Nervous System
 Lat. Ventricles:  5.2                    Cisterna Magna:
Cervix Uterus Adnexa
 Cervical Length:    3.13     cm
 Cervix:       Normal appearance by transabdominal scan.
 Uterus:       No abnormality visualized.
 Left Ovary:    Size(cm) L: 2.94 x W: 2.08 x H: 1.73  Volume(cc):
 Right Ovary:   Size(cm) L: 2.34 x W: 2.21 x H: 1.13  Volume(cc):
 Adnexa:     No abnormality visualized. No adnexal mass visualized.
Impression
 22 yr old G1P0 at 12w5d for fetal anatomic survey. Remote
 read.

[Series 1: us ob comp +14 wk · 1 of 9 slices shown (1 of 2)]
[im 5/9]
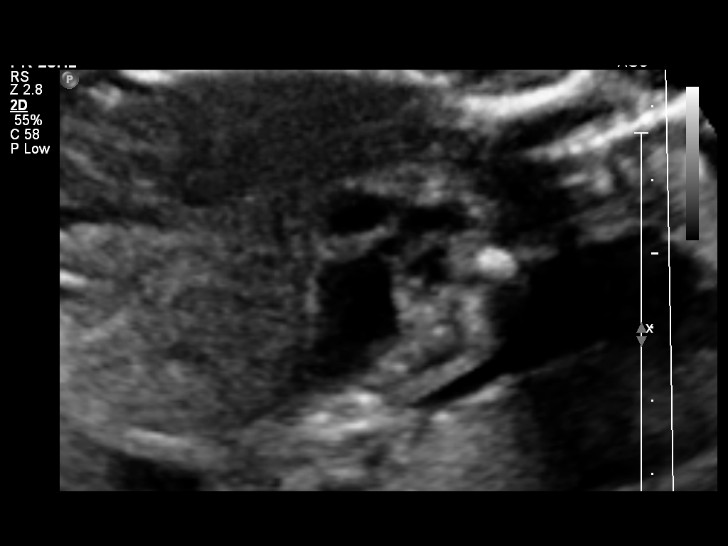

[Series 1: us ob comp +14 wk · 11 of 79 slices shown (2 of 2)]
[im 1/79]
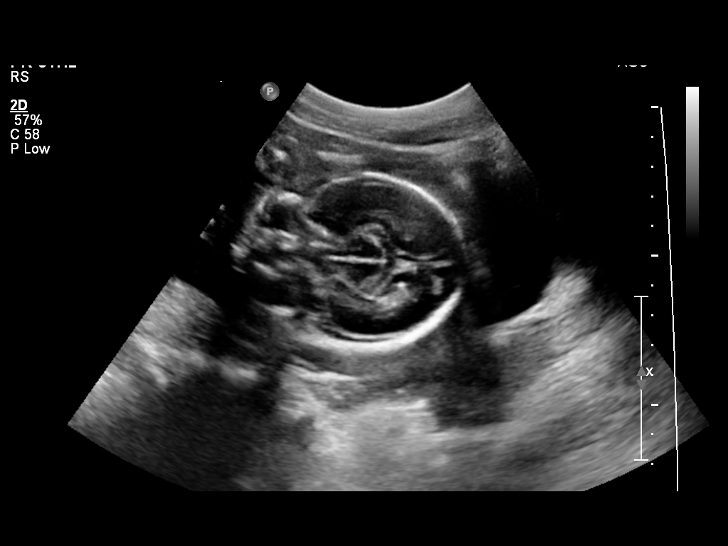
[im 7/79]
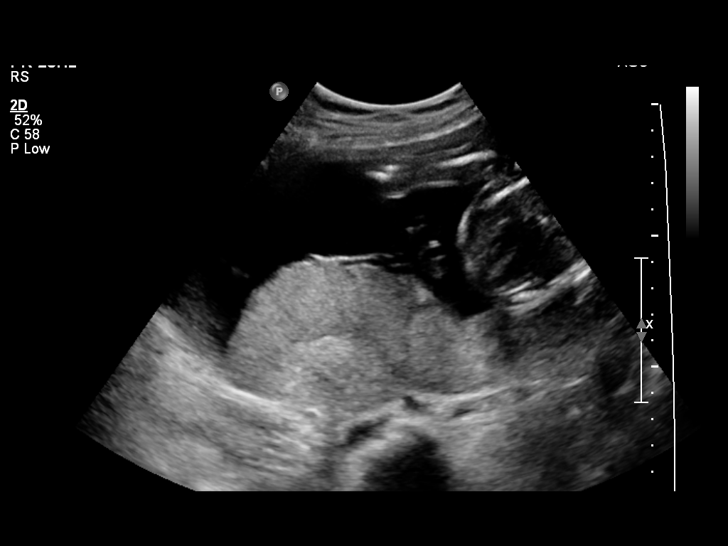
[im 17/79]
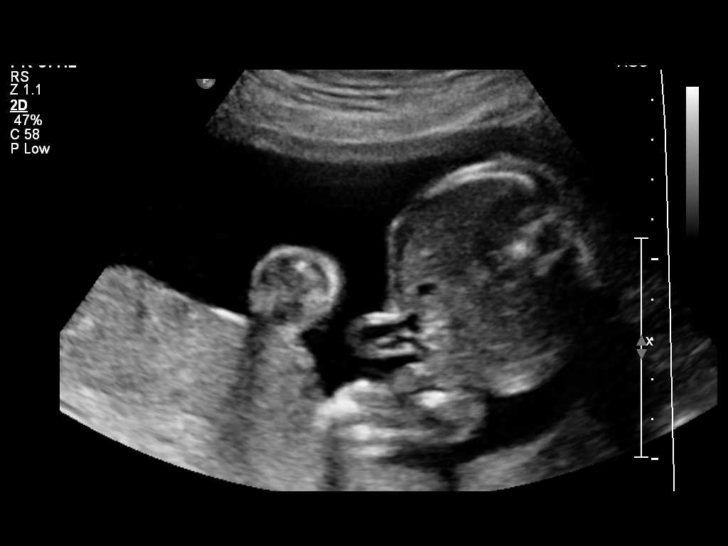
[im 23/79]
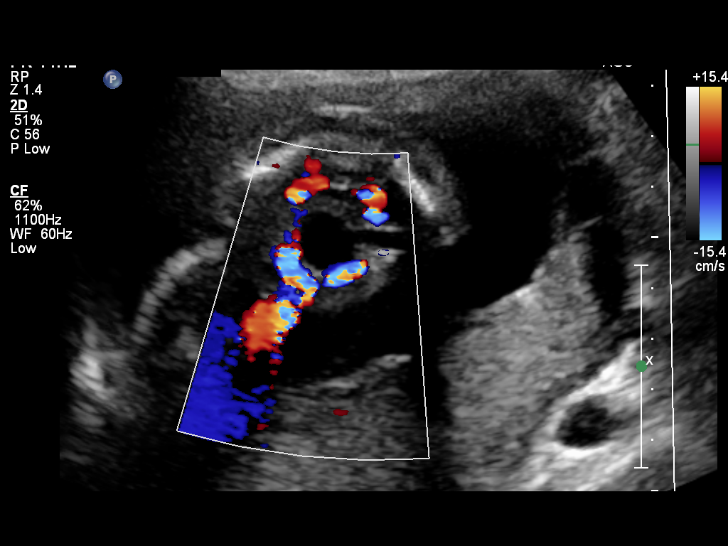
[im 30/79]
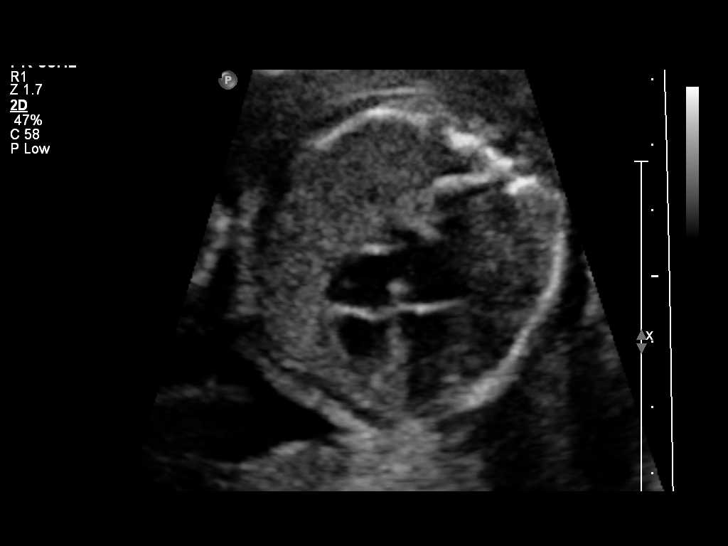
[im 40/79]
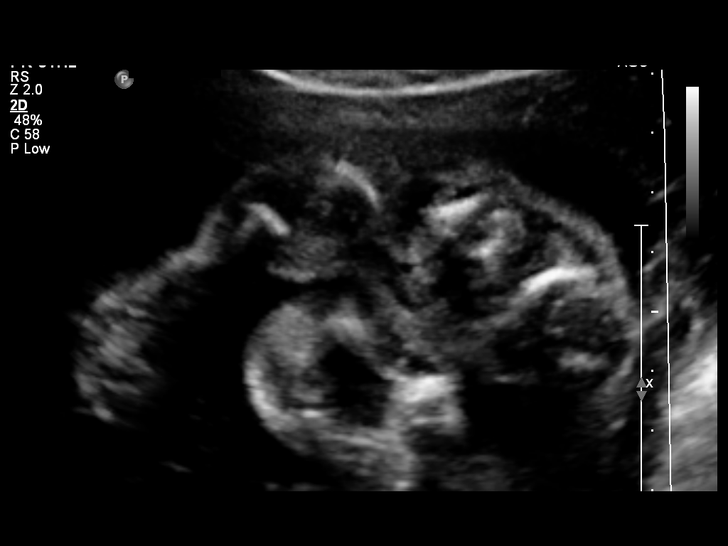
[im 46/79]
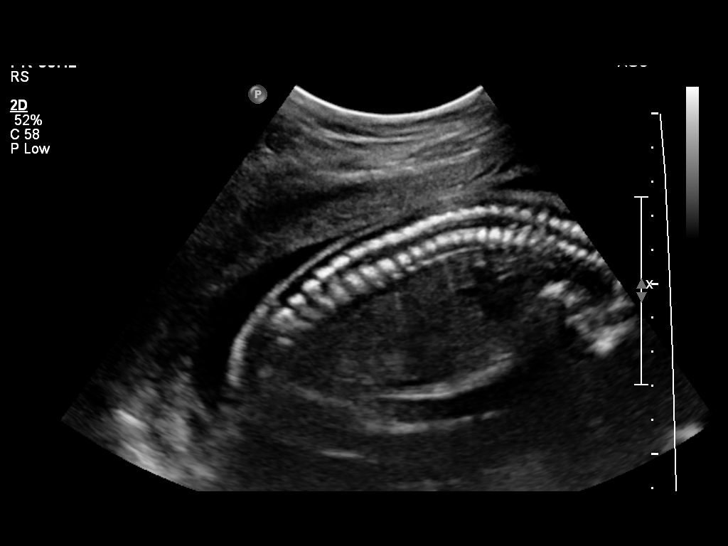
[im 53/79]
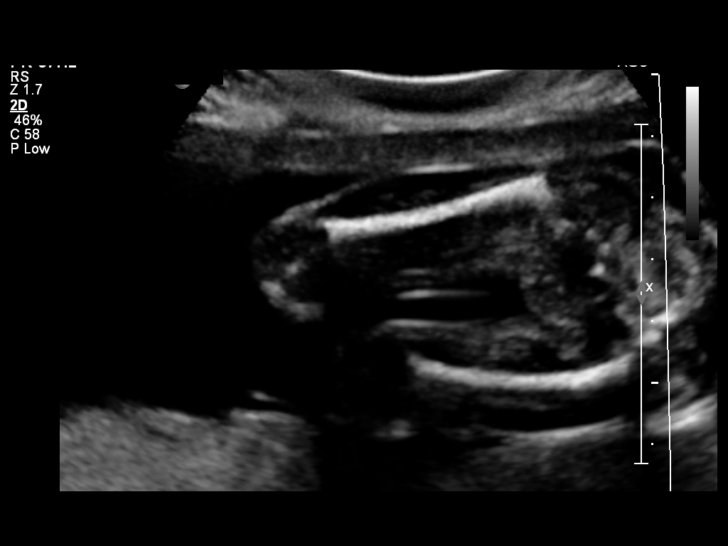
[im 62/79]
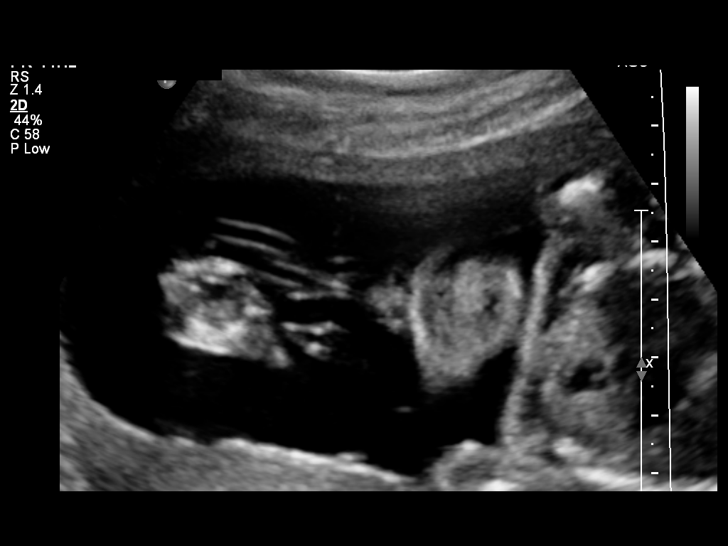
[im 69/79]
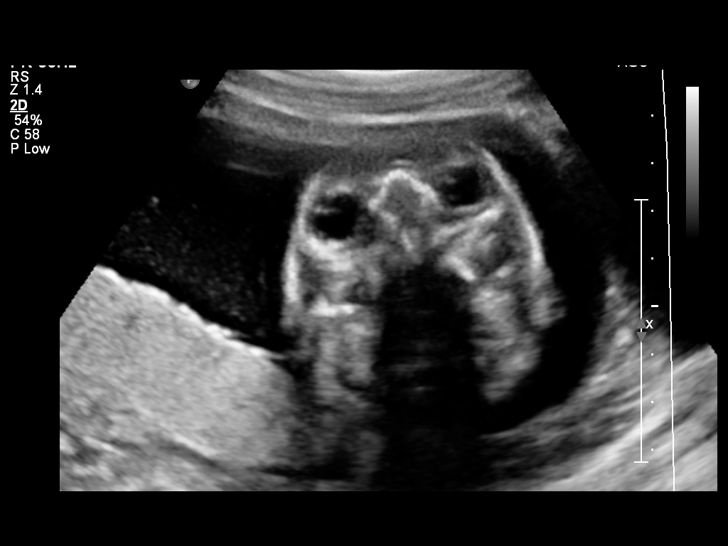
[im 75/79]
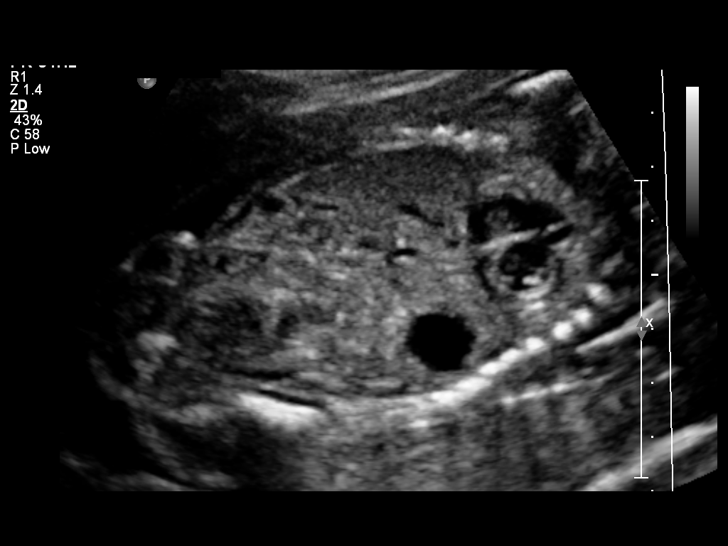

[12 of 28 positions shown; findings below may reference images not displayed]

FINDINGS: 1. Single intrauterine pregnancy.
 2. Estimated fetal weight is in the 53rd%.
 3. Posterior placenta without evidence of previa.
 4. Normal amniotic fluid volume.
 5. Normal transabdominal cervical length.
 6. The views of the profile/nasal bone, heart, and hands are
 limited.
 7. The remainder of the limited anatomy survey is normal.
Recommendations

 1. Appropriate fetal growth.
 2. Limited anatomy survey:
 - recommend follow up ultrasound in 3-4 weeks to complete
 anatomic survey
 questions or concerns.
